# Patient Record
Sex: Male | Born: 1990 | Race: White | Hispanic: No | Marital: Single | Smoking: Current every day smoker
Health system: Southern US, Community
[De-identification: ages and names within clinical notes are randomized; demographics above are authoritative.]

## PROBLEM LIST (undated history)

## (undated) DIAGNOSIS — B1920 Unspecified viral hepatitis C without hepatic coma: Secondary | ICD-10-CM

## (undated) DIAGNOSIS — F329 Major depressive disorder, single episode, unspecified: Secondary | ICD-10-CM

## (undated) DIAGNOSIS — F32A Depression, unspecified: Secondary | ICD-10-CM

## (undated) DIAGNOSIS — F419 Anxiety disorder, unspecified: Secondary | ICD-10-CM

## (undated) HISTORY — PX: APPENDECTOMY: SHX54

---
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
## 2014-12-25 ENCOUNTER — Emergency Department (HOSPITAL_BASED_OUTPATIENT_CLINIC_OR_DEPARTMENT_OTHER)
Admission: EM | Admit: 2014-12-25 | Discharge: 2014-12-25 | Disposition: A | Discharge status: Home / Self Care | Payer: BLUE CROSS/BLUE SHIELD | Attending: Emergency Medicine | Admitting: Emergency Medicine

## 2014-12-25 ENCOUNTER — Encounter (HOSPITAL_BASED_OUTPATIENT_CLINIC_OR_DEPARTMENT_OTHER): Payer: Self-pay | Admitting: *Deleted

## 2014-12-25 DIAGNOSIS — R1310 Dysphagia, unspecified: Secondary | ICD-10-CM | POA: Insufficient documentation

## 2014-12-25 DIAGNOSIS — R000 Tachycardia, unspecified: Secondary | ICD-10-CM | POA: Diagnosis not present

## 2014-12-25 DIAGNOSIS — F329 Major depressive disorder, single episode, unspecified: Secondary | ICD-10-CM | POA: Insufficient documentation

## 2014-12-25 DIAGNOSIS — Z8619 Personal history of other infectious and parasitic diseases: Secondary | ICD-10-CM | POA: Diagnosis not present

## 2014-12-25 DIAGNOSIS — R110 Nausea: Secondary | ICD-10-CM | POA: Diagnosis not present

## 2014-12-25 DIAGNOSIS — R251 Tremor, unspecified: Secondary | ICD-10-CM | POA: Diagnosis not present

## 2014-12-25 DIAGNOSIS — Z79899 Other long term (current) drug therapy: Secondary | ICD-10-CM | POA: Insufficient documentation

## 2014-12-25 DIAGNOSIS — T43215A Adverse effect of selective serotonin and norepinephrine reuptake inhibitors, initial encounter: Secondary | ICD-10-CM | POA: Diagnosis not present

## 2014-12-25 DIAGNOSIS — R197 Diarrhea, unspecified: Secondary | ICD-10-CM | POA: Insufficient documentation

## 2014-12-25 DIAGNOSIS — F419 Anxiety disorder, unspecified: Secondary | ICD-10-CM | POA: Diagnosis not present

## 2014-12-25 DIAGNOSIS — Z720 Tobacco use: Secondary | ICD-10-CM | POA: Insufficient documentation

## 2014-12-25 DIAGNOSIS — M79675 Pain in left toe(s): Secondary | ICD-10-CM | POA: Diagnosis not present

## 2014-12-25 DIAGNOSIS — R61 Generalized hyperhidrosis: Secondary | ICD-10-CM | POA: Insufficient documentation

## 2014-12-25 DIAGNOSIS — T50905A Adverse effect of unspecified drugs, medicaments and biological substances, initial encounter: Secondary | ICD-10-CM

## 2014-12-25 HISTORY — DX: Anxiety disorder, unspecified: F41.9

## 2014-12-25 HISTORY — DX: Unspecified viral hepatitis C without hepatic coma: B19.20

## 2014-12-25 HISTORY — DX: Depression, unspecified: F32.A

## 2014-12-25 HISTORY — DX: Major depressive disorder, single episode, unspecified: F32.9

## 2014-12-25 MED ORDER — TRAMADOL HCL 50 MG PO TABS: 50.0000 mg | ORAL_TABLET | Freq: Four times a day (QID) | ORAL | Status: AC | PRN

## 2014-12-25 MED ORDER — LORAZEPAM 1 MG PO TABS
1.0000 mg | ORAL_TABLET | Freq: Once | ORAL | Status: AC
Start: 2014-12-25 — End: 2014-12-25
  Administered 2014-12-25: 1 mg via ORAL
  Filled 2014-12-25: qty 1

## 2014-12-25 NOTE — ED Provider Notes (Signed)
CSN: [id number]     Arrival date & time 12/25/14  2024 History   First MD Initiated Contact with Patient 12/25/14 2220     Chief Complaint  Patient presents with  . Medication Reaction     (Consider location/radiation/quality/duration/timing/severity/associated sxs/prior Treatment) HPI Comments: 24 year old male presenting with concerns of an adverse reaction to Pristiq. He took this medication for the first time today around 12:00 PM, and shortly after, he developed agitation, anxiety, jitteriness, difficulty concentrating, nausea, 2 episodes of diarrhea, difficulty swallowing, diaphoresis and increased emotions. Since arriving to the ED, the symptoms have slowly started to decrease. He was prescribed this medication a few months back, however has been hesitant to start it as he "does not like SSRIs or SNRIs". He tried taking Clonopin with minimal relief. Denies ever having a reaction like this in the past. States he is feeling fine prior to the onset of symptoms. He also endorses some pain to his left big toe after his nail fell off a few weeks back. States it is only painful when it is in his shoe or sock. Denies drainage.  The history is provided by the patient.    Past Medical History  Diagnosis Date  . Hepatitis C   . Depressive disorder   . Anxiety    Past Surgical History  Procedure Laterality Date  . Appendectomy     No family history on file. History  Substance Use Topics  . Smoking status: Current Every Day Smoker  . Smokeless tobacco: Never Used  . Alcohol Use: Yes     Comment: 2-3x/week    Review of Systems  10 Systems reviewed and are negative for acute change except as noted in the HPI.  Allergies  Review of patient's allergies indicates no known allergies.  Home Medications   Prior to Admission medications   Medication Sig Start Date End Date Taking? Authorizing Provider  clonazePAM (KLONOPIN) 1 MG tablet Take 1 mg by mouth 3 (three) times daily as needed  for anxiety.   Yes Historical Provider, MD  desvenlafaxine (PRISTIQ) 50 MG 24 hr tablet Take 50 mg by mouth daily.   Yes Historical Provider, MD  gabapentin (NEURONTIN) 600 MG tablet Take 600 mg by mouth 4 (four) times daily.   Yes Historical Provider, MD  traMADol (ULTRAM) 50 MG tablet Take 1 tablet (50 mg total) by mouth every 6 (six) hours as needed. 12/25/14   Jamarian Jacinto M Gabriell Casimir, PA-C   BP 137/82 mmHg  Pulse 83  Temp(Src) 97.7 F (36.5 C) (Oral)  Resp 18  Ht 6\' 1"  (1.854 m)  Wt 230 lb (104.327 kg)  BMI 30.35 kg/m2  SpO2 100% Physical Exam  Constitutional: He is oriented to person, place, and time. He appears well-developed and well-nourished. No distress.  HENT:  Head: Normocephalic and atraumatic.  Eyes: Conjunctivae and EOM are normal.  Neck: Normal range of motion. Neck supple.  Cardiovascular: Regular rhythm and normal heart sounds.  Tachycardia present.   Pulmonary/Chest: Effort normal and breath sounds normal.  Musculoskeletal: Normal range of motion. He exhibits no edema.  Well healed left great toe from where toenail is missing. No erythema, edema, warmth or drainage. Mildly tender.  Neurological: He is alert and oriented to person, place, and time. He has normal strength. Gait normal. GCS eye subscore is 4. GCS verbal subscore is 5. GCS motor subscore is 6.  Skin: Skin is warm and dry.  Psychiatric: His speech is normal and behavior is normal. His mood appears anxious.  Nursing note and vitals reviewed.   ED Course  Procedures (including critical care time) Labs Review Labs Reviewed - No data to display  Imaging Review No results found.   EKG Interpretation None      MDM   Final diagnoses:  Medication reaction, initial encounter  Great toe pain, left   NAD. Tachycardic on arrival, vitals otherwise stable. I advised patient to no longer take Pristiq, and to call his psychiatrist tomorrow morning to tell her that he took this medication for the first time and did  not react well to it. No associated rash or respiratory/airway compromise. Ativan given in the emergency department with some relief of his symptoms, and improvement in HR. Regarding the toe, I advised warm soaks. Tramadol for pain. Follow-up with PCP. Stable for discharge. Return precautions given. Patient states understanding of treatment care plan and is agreeable.  Kathrynn Speed, PA-C 12/25/14 2332  Richardean Canal, MD 12/25/14 [phone]

## 2014-12-25 NOTE — ED Notes (Signed)
Pt reports he took new med today (pristique) and since 1200 believes he is having an adverse reaction- reports: agitation, anxiety, jittery, difficulty concentrating, nausea, difficulty urinating, diarrhea x 2, difficulty swallowing, "emotionally sensitive", sweats,

## 2014-12-25 NOTE — Discharge Instructions (Signed)
Take tramadol as directed for pain. No longer take Pristiq. Discuss the reaction and symptoms that you had this evening with your psychiatrist. Drug Allergy Allergic reactions to medicines are common. Some allergic reactions are mild. A delayed type of drug allergy that occurs 1 week or more after exposure to a medicine or vaccine is called serum sickness. A life-threatening, sudden (acute) allergic reaction that involves the whole body is called anaphylaxis. CAUSES  "True" drug allergies occur when there is an allergic reaction to a medicine. This is caused by overactivity of the immune system. First, the body becomes sensitized. The immune system is triggered by your first exposure to the medicine. Following this first exposure, future exposure to the same medicine may be life-threatening. Almost any medicine can cause an allergic reaction. Common ones are:  Penicillin.  Sulfonamides (sulfa drugs).  Local anesthetics.  X-ray dyes that contain iodine. SYMPTOMS  Common symptoms of a minor allergic reaction are:  Swelling around the mouth.  An itchy red rash or hives.  Vomiting or diarrhea. Anaphylaxis can cause swelling of the mouth and throat. This makes it difficult to breathe and swallow. Severe reactions can be fatal within seconds, even after exposure to only a trace amount of the drug that causes the reaction. HOME CARE INSTRUCTIONS   If you are unsure of what caused your reaction, keep a diary of foods and medicines used. Include the symptoms that followed. Avoid anything that causes reactions.  You may want to follow up with an allergy specialist after the reaction has cleared in order to be tested to confirm the allergy. It is important to confirm that your reaction is an allergy, not just a side effect to the medicine. If you have a true allergy to a medicine, this may prevent that medicine and related medicines from being given to you when you are very ill.  If you have hives  or a rash:  Take medicines as directed by your caregiver.  You may use an over-the-counter antihistamine (diphenhydramine) as needed.  Apply cold compresses to the skin or take baths in cool water. Avoid hot baths or showers.  If you are severely allergic:  Continuous observation after a severe reaction may be needed. Hospitalization is often required.  Wear a medical alert bracelet or necklace stating your allergy.  You and your family must learn how to use an anaphylaxis kit or give an epinephrine injection to temporarily treat an emergency allergic reaction. If you have had a severe reaction, always carry your epinephrine injection or anaphylaxis kit with you. This can be lifesaving if you have a severe reaction.  Do not drive or perform tasks after treatment until the medicines used to treat your reaction have worn off, or until your caregiver says it is okay. SEEK MEDICAL CARE IF:   You think you had an allergic reaction. Symptoms usually start within 30 minutes after exposure.  Symptoms are getting worse rather than better.  You develop new symptoms.  The symptoms that brought you to your caregiver return. SEEK IMMEDIATE MEDICAL CARE IF:   You have swelling of the mouth, difficulty breathing, or wheezing.  You have a tight feeling in your chest or throat.  You develop hives, swelling, or itching all over your body.  You develop severe vomiting or diarrhea.  You feel faint or pass out. This is an emergency. Use your epinephrine injection or anaphylaxis kit as you have been instructed. Call for emergency medical help. Even if you improve after  the injection, you need to be examined at a hospital emergency department. MAKE SURE YOU:   Understand these instructions.  Will watch your condition.  Will get help right away if you are not doing well or get worse. Document Released: 09/02/2005 Document Revised: 11/25/2011 Document Reviewed: 02/06/2011 [facility] Patient  Information 2015 [place], [place]. This information is not intended to replace advice given to you by your health care provider. Make sure you discuss any questions you have with your health care provider.

## 2014-12-25 NOTE — ED Notes (Signed)
PA at bedside.
